# Patient Record
Sex: Female | Born: 1985 | Race: Black or African American | Hispanic: No | Marital: Single | Smoking: Never smoker
Health system: Southern US, Community
[De-identification: ages and names within clinical notes are randomized; demographics above are authoritative.]

## PROBLEM LIST (undated history)

## (undated) HISTORY — PX: HERNIA REPAIR: SHX51

## (undated) HISTORY — PX: TENDON REPAIR: SHX5111

---

## 2019-02-25 ENCOUNTER — Other Ambulatory Visit: Payer: Self-pay

## 2019-02-25 ENCOUNTER — Emergency Department
Admission: EM | Admit: 2019-02-25 | Discharge: 2019-02-25 | Disposition: A | Discharge status: Home / Self Care | Payer: 59 | Attending: Emergency Medicine | Admitting: Emergency Medicine

## 2019-02-25 ENCOUNTER — Emergency Department: Payer: 59

## 2019-02-25 ENCOUNTER — Encounter: Payer: Self-pay | Admitting: Emergency Medicine

## 2019-02-25 DIAGNOSIS — N760 Acute vaginitis: Secondary | ICD-10-CM

## 2019-02-25 DIAGNOSIS — M545 Low back pain, unspecified: Secondary | ICD-10-CM

## 2019-02-25 DIAGNOSIS — R1030 Lower abdominal pain, unspecified: Secondary | ICD-10-CM | POA: Diagnosis present

## 2019-02-25 DIAGNOSIS — B9689 Other specified bacterial agents as the cause of diseases classified elsewhere: Secondary | ICD-10-CM

## 2019-02-25 LAB — CBC WITH DIFFERENTIAL/PLATELET
Abs Immature Granulocytes: 0.01 10*3/uL (ref 0.00–0.07)
Basophils Absolute: 0 10*3/uL (ref 0.0–0.1)
Basophils Relative: 1 %
Eosinophils Absolute: 0.1 10*3/uL (ref 0.0–0.5)
Eosinophils Relative: 1 %
HCT: 37.7 % (ref 36.0–46.0)
Hemoglobin: 11.7 g/dL — ABNORMAL LOW (ref 12.0–15.0)
Immature Granulocytes: 0 %
Lymphocytes Relative: 41 %
Lymphs Abs: 2 10*3/uL (ref 0.7–4.0)
MCH: 26.8 pg (ref 26.0–34.0)
MCHC: 31 g/dL (ref 30.0–36.0)
MCV: 86.3 fL (ref 80.0–100.0)
Monocytes Absolute: 0.4 10*3/uL (ref 0.1–1.0)
Monocytes Relative: 7 %
Neutro Abs: 2.4 10*3/uL (ref 1.7–7.7)
Neutrophils Relative %: 50 %
Platelets: 289 10*3/uL (ref 150–400)
RBC: 4.37 MIL/uL (ref 3.87–5.11)
RDW: 14.6 % (ref 11.5–15.5)
WBC: 4.9 10*3/uL (ref 4.0–10.5)
nRBC: 0 % (ref 0.0–0.2)

## 2019-02-25 LAB — WET PREP, GENITAL
Sperm: NONE SEEN
Trich, Wet Prep: NONE SEEN
Yeast Wet Prep HPF POC: NONE SEEN

## 2019-02-25 LAB — CHLAMYDIA/NGC RT PCR (ARMC ONLY)
Chlamydia Tr: NOT DETECTED
N GONORRHOEAE: NOT DETECTED

## 2019-02-25 LAB — COMPREHENSIVE METABOLIC PANEL
ALT: 13 U/L (ref 0–44)
AST: 16 U/L (ref 15–41)
Albumin: 3.7 g/dL (ref 3.5–5.0)
Alkaline Phosphatase: 50 U/L (ref 38–126)
Anion gap: 7 (ref 5–15)
BILIRUBIN TOTAL: 0.4 mg/dL (ref 0.3–1.2)
BUN: 9 mg/dL (ref 6–20)
CO2: 26 mmol/L (ref 22–32)
Calcium: 9.2 mg/dL (ref 8.9–10.3)
Chloride: 106 mmol/L (ref 98–111)
Creatinine, Ser: 0.66 mg/dL (ref 0.44–1.00)
GFR calc Af Amer: >60 mL/min (ref >60)
Glucose, Bld: 96 mg/dL (ref 70–99)
Potassium: 3.8 mmol/L (ref 3.5–5.1)
Sodium: 139 mmol/L (ref 135–145)
TOTAL PROTEIN: 7.8 g/dL (ref 6.5–8.1)

## 2019-02-25 LAB — URINALYSIS, COMPLETE (UACMP) WITH MICROSCOPIC
Bacteria, UA: NONE SEEN
Bilirubin Urine: NEGATIVE
Glucose, UA: NEGATIVE mg/dL
KETONES UR: NEGATIVE mg/dL
Leukocytes,Ua: NEGATIVE
Nitrite: NEGATIVE
Protein, ur: NEGATIVE mg/dL
Specific Gravity, Urine: 1.002 — ABNORMAL LOW (ref 1.005–1.030)
pH: 6 (ref 5.0–8.0)

## 2019-02-25 LAB — PREGNANCY, URINE: Preg Test, Ur: NEGATIVE

## 2019-02-25 LAB — LIPASE, BLOOD: Lipase: 32 U/L (ref 11–51)

## 2019-02-25 MED ORDER — IOHEXOL 240 MG/ML SOLN: 25.0000 mL | INTRAMUSCULAR | Status: DC

## 2019-02-25 MED ORDER — IOHEXOL 240 MG/ML SOLN
50.0000 mL | Freq: Once | INTRAMUSCULAR | Status: AC
  Administered 2019-02-25: 50 mL via ORAL

## 2019-02-25 MED ORDER — ACETAMINOPHEN 500 MG PO TABS
1000.0000 mg | ORAL_TABLET | Freq: Once | ORAL | Status: AC
  Administered 2019-02-25: 1000 mg via ORAL
  Filled 2019-02-25: qty 2

## 2019-02-25 MED ORDER — IOHEXOL 300 MG/ML  SOLN
100.0000 mL | Freq: Once | INTRAMUSCULAR | Status: AC | PRN
  Administered 2019-02-25: 100 mL via INTRAVENOUS

## 2019-02-25 MED ORDER — METRONIDAZOLE 500 MG PO TABS: 500.0000 mg | ORAL_TABLET | Freq: Three times a day (TID) | ORAL | 0 refills | Status: DC

## 2019-02-25 MED ORDER — MORPHINE SULFATE (PF) 4 MG/ML IV SOLN
4.0000 mg | Freq: Once | INTRAVENOUS | Status: AC
  Administered 2019-02-25: 4 mg via INTRAVENOUS
  Filled 2019-02-25: qty 1

## 2019-02-25 MED ORDER — MORPHINE SULFATE (PF) 2 MG/ML IV SOLN
2.0000 mg | Freq: Once | INTRAVENOUS | Status: AC
  Administered 2019-02-25: 2 mg via INTRAVENOUS
  Filled 2019-02-25: qty 1

## 2019-02-25 MED ORDER — ONDANSETRON HCL 4 MG/2ML IJ SOLN
4.0000 mg | Freq: Once | INTRAMUSCULAR | Status: AC
  Administered 2019-02-25: 4 mg via INTRAVENOUS
  Filled 2019-02-25: qty 2

## 2019-02-25 NOTE — ED Triage Notes (Signed)
Says low abdominal pain and pain in left low back for a week, but got worse today and she could not go to work. No urinary symptoms.

## 2019-02-25 NOTE — ED Provider Notes (Signed)
[facility] Emergency Department Provider Note   ____________________________________________   First MD Initiated Contact with Patient 02/25/19 1237     (approximate)  I have reviewed the triage vital signs and the nursing notes.   HISTORY  Chief Complaint Abdominal Pain and Back Pain    HPI Tina Bryan is a 33 y.o. female who reports low back pain and lower abdominal pain going on for about a week but is gotten much worse in the last day or so.  Is worse with movement and slightly worse with walking as well.  She has no fever vomiting diarrhea no coughing no other complaints but the pain.  Pain is currently moderately severe deep and achy         History reviewed. No pertinent past medical history. History of depression and anxiety There are no active problems to display for this patient.   Past Surgical History:  Procedure Laterality Date  . HERNIA REPAIR    . TENDON REPAIR     left finger    Prior to Admission medications   Not on File    Allergies Patient has no known allergies.  No family history on file.  Social History Social History   Tobacco Use  . Smoking status: Never Smoker  . Smokeless tobacco: Never Used  Substance Use Topics  . Alcohol use: Never    Frequency: Never  . Drug use: Not on file    Review of Systems  Constitutional: No fever/chills Eyes: No visual changes. ENT: No sore throat. Cardiovascular: Denies chest pain. Respiratory: Denies shortness of breath. Gastrointestinal: Lower abdominal pain.  No nausea, no vomiting.  No diarrhea.  No constipation. Genitourinary: Negative for dysuria. Musculoskeletal: Low back pain. Skin: Negative for rash. Neurological: Negative for headaches, focal weakness   ____________________________________________   PHYSICAL EXAM:  VITAL SIGNS: ED Triage Vitals  Enc Vitals Group     BP 02/25/19 1227 118/61     Pulse Rate 02/25/19 1227 75     Resp 02/25/19 1227  14     Temp 02/25/19 1227 98 F (36.7 C)     Temp src --      SpO2 02/25/19 1227 100 %     Weight 02/25/19 1228 170 lb (77.1 kg)     Height 02/25/19 1228 5\' 4"  (1.626 m)     Head Circumference --      Peak Flow --      Pain Score 02/25/19 1227 7     Pain Loc --      Pain Edu? --      Excl. in GC? --     Constitutional: Alert and oriented.  Looks uncomfortable sitting on stretcher. Eyes: Conjunctivae are normal.  Head: Atraumatic. Nose: No congestion/rhinnorhea. Mouth/Throat: Mucous membranes are moist.  Oropharynx non-erythematous. Neck: No stridor.  Cardiovascular: Normal rate, regular rhythm. Grossly normal heart sounds.  Good peripheral circulation. Respiratory: Normal respiratory effort.  No retractions. Lungs CTAB. Gastrointestinal: Soft tender to palpation and percussion of the lower abdomen diffusely no distention. No abdominal bruits. No CVA tenderness. Genitourinary: Normal perineum.  Some dark blood in the vagina.  There is no particular tenderness with cervical motion or palpation the adnexa. Musculoskeletal: No lower extremity tenderness nor edema.  Patient is tender to palpation percussion in the paraspinous muscles in the low back.  She is not tender in the CVA areas and she is not tender over the spine itself. Neurologic:  Normal speech and language. No gross focal neurologic  deficits are appreciated. Skin:  Skin is warm, dry and intact. No rash noted.   ____________________________________________   LABS (all labs ordered are listed, but only abnormal results are displayed)  Labs Reviewed  CBC WITH DIFFERENTIAL/PLATELET - Abnormal; Notable for the following components:      Result Value   Hemoglobin 11.7 (*)    All other components within normal limits  URINALYSIS, COMPLETE (UACMP) WITH MICROSCOPIC - Abnormal; Notable for the following components:   Color, Urine COLORLESS (*)    APPearance CLEAR (*)    Specific Gravity, Urine 1.002 (*)    Hgb urine dipstick  LARGE (*)    All other components within normal limits  WET PREP, GENITAL  CHLAMYDIA/NGC RT PCR ([facility] ONLY)  COMPREHENSIVE METABOLIC PANEL  PREGNANCY, URINE  LIPASE, BLOOD   ____________________________________________  EKG   ____________________________________________  RADIOLOGY  ED MD interpretation:   Official radiology report(s): No results found.  ____________________________________________   PROCEDURES  Procedure(s) performed (including Critical Care):  Procedures   ____________________________________________   INITIAL IMPRESSION / ASSESSMENT AND PLAN / ED COURSE  CT scan wet prep and GC chlamydia are still pending.  We will sign the patient out to Dr. Sharion Settler.             ____________________________________________   FINAL CLINICAL IMPRESSION(S) / ED DIAGNOSES  Final diagnoses:  Bilateral low back pain without sciatica, unspecified chronicity  Lower abdominal pain     ED Discharge Orders    None       Note:  This document was prepared using Dragon voice recognition software and may include unintentional dictation errors.    Arnaldo Natal, MD 02/25/19 [phone]

## 2019-02-25 NOTE — Discharge Instructions (Signed)
No driving today.  Recommend follow-up with primary care physician and also set up an outpatient follow-up with  Please return to the emergency room right away if you are to develop a fever, severe nausea, your pain becomes severe or worsens, you are unable to keep food down, begin vomiting any dark or bloody fluid, you develop any dark or bloody stools, feel dehydrated, or other new concerns or symptoms arise.

## 2019-02-25 NOTE — ED Notes (Signed)
Patient [id number]. Vitals stable. NAD. Patient verbalized discharge instruction.

## 2019-02-25 NOTE — ED Notes (Signed)
Transported to CT 

## 2019-02-25 NOTE — ED Provider Notes (Signed)
Ct Abdomen Pelvis W Contrast  Result Date: 02/25/2019 CLINICAL DATA:  Acute lower abdominal pain. EXAM: CT ABDOMEN AND PELVIS WITH CONTRAST TECHNIQUE: Multidetector CT imaging of the abdomen and pelvis was performed using the standard protocol following bolus administration of intravenous contrast. CONTRAST:  OMNIPAQUE IOHEXOL 300 MG/ML  SOLN COMPARISON:  None. FINDINGS: Lower chest: No acute abnormality. Hepatobiliary: No focal liver abnormality is seen. No gallstones, gallbladder wall thickening, or biliary dilatation. Pancreas: Unremarkable. No pancreatic ductal dilatation or surrounding inflammatory changes. Spleen: Normal in size without focal abnormality. Adrenals/Urinary Tract: Adrenal glands are unremarkable. Kidneys are normal, without renal calculi, focal lesion, or hydronephrosis. Bladder is unremarkable. Stomach/Bowel: The stomach and appendix appear normal. There is no evidence of bowel obstruction. Mild wall thickening of the terminal ileum is noted without surrounding inflammation. No other abnormality is noted. Vascular/Lymphatic: No significant vascular findings are present. No enlarged abdominal or pelvic lymph nodes. Reproductive: Uterus and bilateral adnexa are unremarkable. Other: No abdominal wall hernia or abnormality. No abdominopelvic ascites. Musculoskeletal: No acute or significant osseous findings. IMPRESSION: Mild wall thickening of terminal ileum is noted without surrounding inflammation; the possibility of Crohn's disease or inflammatory bowel disease can not be excluded. Clinical correlation is recommended. No other abnormality seen in the abdomen or pelvis. Electronically Signed   By: Lupita Raider, M.D.   On: 02/25/2019 16:02     CT scan results reviewed, mild wall thickening of the terminal ileum.  No surrounding inflammatory change.  ----------------------------------------- 4:12 PM on 02/25/2019 -----------------------------------------  Spoke with the  patient, she reports that still very low bilateral discomfort primarily in her back region.  Does come up to her abdomen but denies it being right-sided in nature.  Will evaluate further rule out ovarian pathology with transvaginal ultrasound.  Pain about a 4 out of 10 and she would like a half dose of pain medicine which I have ordered for her.  She is fully awake and alert at this time.  Await GC negative  Vitals:   02/25/19 1435 02/25/19 1820  BP: 111/72 104/63  Pulse: 60 72  Resp: 16 14  Temp: 97.8 F (36.6 C) 98.5 F (36.9 C)  SpO2: 100% 100%   Resting comfortably.  Treat for bacterial vaginosis, discussed careful return precautions and follow-up including GI follow-up recommendation.  Patient in agreement  Return precautions and treatment recommendations and follow-up discussed with the patient who is agreeable with the plan.  Not driving herself.     Sharyn Creamer, MD 02/25/19 [phone]

## 2019-02-25 NOTE — ED Notes (Signed)
First nurse note: pt states abd pain "for a while now" but wants to be checked because her mom died of pancreatic cancer. Pt in NAD at this time.

## 2019-03-03 ENCOUNTER — Emergency Department
Admission: EM | Admit: 2019-03-03 | Discharge: 2019-03-03 | Disposition: A | Discharge status: Home / Self Care | Payer: 59 | Attending: Emergency Medicine | Admitting: Emergency Medicine

## 2019-03-03 ENCOUNTER — Encounter: Payer: Self-pay | Admitting: Emergency Medicine

## 2019-03-03 ENCOUNTER — Other Ambulatory Visit: Payer: Self-pay

## 2019-03-03 DIAGNOSIS — R0981 Nasal congestion: Secondary | ICD-10-CM | POA: Insufficient documentation

## 2019-03-03 DIAGNOSIS — J3489 Other specified disorders of nose and nasal sinuses: Secondary | ICD-10-CM | POA: Diagnosis not present

## 2019-03-03 DIAGNOSIS — J302 Other seasonal allergic rhinitis: Secondary | ICD-10-CM | POA: Insufficient documentation

## 2019-03-03 DIAGNOSIS — H5789 Other specified disorders of eye and adnexa: Secondary | ICD-10-CM | POA: Diagnosis present

## 2019-03-03 MED ORDER — ONDANSETRON 4 MG PO TBDP
4.0000 mg | ORAL_TABLET | Freq: Once | ORAL | Status: AC
  Administered 2019-03-03: 4 mg via ORAL
  Filled 2019-03-03: qty 1

## 2019-03-03 MED ORDER — ONDANSETRON 4 MG PO TBDP: 4.0000 mg | ORAL_TABLET | Freq: Three times a day (TID) | ORAL | 0 refills | Status: AC | PRN

## 2019-03-03 MED ORDER — FLUTICASONE PROPIONATE 50 MCG/ACT NA SUSP: 2.0000 | Freq: Every day | NASAL | 0 refills | Status: AC

## 2019-03-03 MED ORDER — CETIRIZINE HCL 10 MG PO TABS: 10.0000 mg | ORAL_TABLET | Freq: Every day | ORAL | 1 refills | Status: AC

## 2019-03-03 NOTE — Discharge Instructions (Addendum)
Follow-up with your primary care provider if any continued problems or can no clinic acute care.  Begin using Flonase nasal spray 2 sprays each side your nose every day.  Zyrtec 1 daily as needed for allergy symptoms.  Increase fluids.

## 2019-03-03 NOTE — ED Provider Notes (Signed)
[facility] Emergency Department Provider Note  ____________________________________________   First MD Initiated Contact with Patient 03/03/19 1042     (approximate)  I have reviewed the triage vital signs and the nursing notes.   HISTORY  Chief Complaint Eye Drainage and Nasal Congestion    HPI Tina Bryan is a 33 y.o. female presents to the ED with complaint of watery itchy eyes that began this morning.  Patient also has had some clear rhinorrhea.  She denies any history of cough, fever or recent travel or exposure to COVID-19.  Patient states in the past she has had problems with allergies and currently is not taking any allergy to medication.     History reviewed. No pertinent past medical history.  There are no active problems to display for this patient.   Past Surgical History:  Procedure Laterality Date  . HERNIA REPAIR    . TENDON REPAIR     left finger    Prior to Admission medications   Medication Sig Start Date End Date Taking? Authorizing Provider  cetirizine (ZYRTEC) 10 MG tablet Take 1 tablet (10 mg total) by mouth daily. 03/03/19   Tommi Rumps, PA-C  fluticasone (FLONASE) 50 MCG/ACT nasal spray Place 2 sprays into both nostrils daily. 03/03/19 03/02/20  Tommi Rumps, PA-C  ondansetron (ZOFRAN ODT) 4 MG disintegrating tablet Take 1 tablet (4 mg total) by mouth every 8 (eight) hours as needed for nausea or vomiting. 03/03/19   Tommi Rumps, PA-C    Allergies Patient has no known allergies.  No family history on file.  Social History Social History   Tobacco Use  . Smoking status: Never Smoker  . Smokeless tobacco: Never Used  Substance Use Topics  . Alcohol use: Never    Frequency: Never  . Drug use: Not on file    Review of Systems Constitutional: No fever/chills Eyes: No visual changes. ENT: No sore throat.  Positive clear rhinorrhea.  Positive bilateral ear pain. Cardiovascular: Denies chest pain.  Respiratory: Denies shortness of breath. Gastrointestinal: No abdominal pain.  No nausea, no vomiting.  Musculoskeletal: Negative for muscle aches. Skin: Negative for rash. Neurological: Negative for headaches, focal weakness or numbness. ___________________________________________   PHYSICAL EXAM:  VITAL SIGNS: ED Triage Vitals [03/03/19 1043]  Enc Vitals Group     BP      Pulse      Resp      Temp      Temp src      SpO2      Weight      Height      Head Circumference      Peak Flow      Pain Score 2     Pain Loc      Pain Edu?      Excl. in GC?    Constitutional: Alert and oriented. Well appearing and in no acute distress. Eyes: Conjunctivae are normal. PERRL. EOMI. Head: Atraumatic. Nose: Minimal congestion/no rhinnorhea.  TMs are dull but no erythema or injection is noted. Mouth/Throat: Mucous membranes are moist.  Oropharynx non-erythematous. Neck: No stridor.   Hematological/Lymphatic/Immunilogical: No cervical lymphadenopathy. Cardiovascular: Normal rate, regular rhythm. Grossly normal heart sounds.  Good peripheral circulation. Respiratory: Normal respiratory effort.  No retractions. Lungs CTAB. Musculoskeletal: Moves upper and lower extremities without any difficulty.  Normal gait was noted. Neurologic:  Normal speech and language. No gross focal neurologic deficits are appreciated. No gait instability. Skin:  Skin is warm, dry and  intact. No rash noted. Psychiatric: Mood and affect are normal. Speech and behavior are normal.  ____________________________________________   LABS (all labs ordered are listed, but only abnormal results are displayed)  Labs Reviewed - No data to display  PROCEDURES  Procedure(s) performed (including Critical Care):  Procedures   ____________________________________________   INITIAL IMPRESSION / ASSESSMENT AND PLAN / ED COURSE  As part of my medical decision making, I reviewed the following data within the  electronic MEDICAL RECORD NUMBER Notes from prior ED visits and [place] Controlled Substance Database  Patient presents to the ED with allergy-like symptoms including watery eyes, itching, clear rhinorrhea but no fever or upper respiratory symptoms suggestive of COVID-19.  Patient is afebrile while in the ED.  Patient has a history of seasonal allergies in the past but has not been taking any over-the-counter medication.  Patient was given a prescription for Zyrtec 10 mg daily and Flonase nose spray.  She also requested a prescription for Zofran as needed for nausea due to drainage.  Patient is to follow-up with her PCP if any continued problems.  ____________________________________________   FINAL CLINICAL IMPRESSION(S) / ED DIAGNOSES  Final diagnoses:  Seasonal allergies     ED Discharge Orders         Ordered    fluticasone (FLONASE) 50 MCG/ACT nasal spray  Daily     03/03/19 1132    cetirizine (ZYRTEC) 10 MG tablet  Daily     03/03/19 1132    ondansetron (ZOFRAN ODT) 4 MG disintegrating tablet  Every 8 hours PRN     03/03/19 1139           Note:  This document was prepared using Dragon voice recognition software and may include unintentional dictation errors.    Tommi Rumps, PA-C 03/03/19 1744    Arnaldo Natal, MD 03/06/19 [phone]

## 2019-03-03 NOTE — ED Triage Notes (Signed)
Presents with runny nose for the past few days  Then developed watery and itchy eyes this am  Denies any cough or fever   Hx of seasonal allergies as a teen  But states she is not taking any meds at this time for allergies

## 2020-12-23 IMAGING — CT CT ABDOMEN AND PELVIS WITH CONTRAST
2 of 4 series · 16 of 46 positions shown, 18 images · IV contrast (APPLIED)
Comparison: None.

CLINICAL DATA: Acute lower abdominal pain.

EXAM:
CT ABDOMEN AND PELVIS WITH CONTRAST
TECHNIQUE: Multidetector CT imaging of the abdomen and pelvis was performed
using the standard protocol following bolus administration of
intravenous contrast.
CONTRAST:  100mL OMNIPAQUE IOHEXOL 300 MG/ML  SOLN

[Series 2: routine abd/pel with · axial · 0.84mm/px · z∈[-961,-511]mm · 13 of 98 slices shown, 15 images]
[im 4/98  soft-tissue]
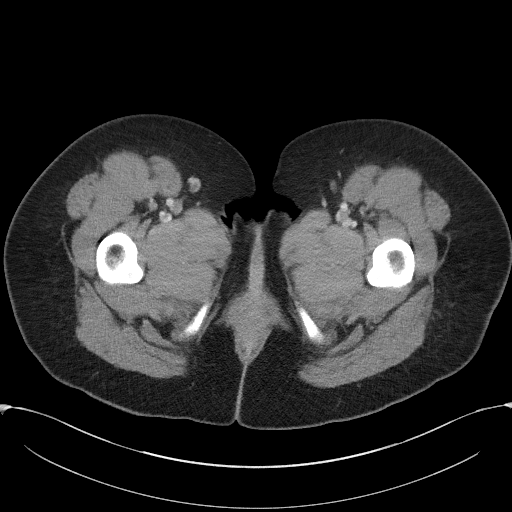
[im 4/98  bone]
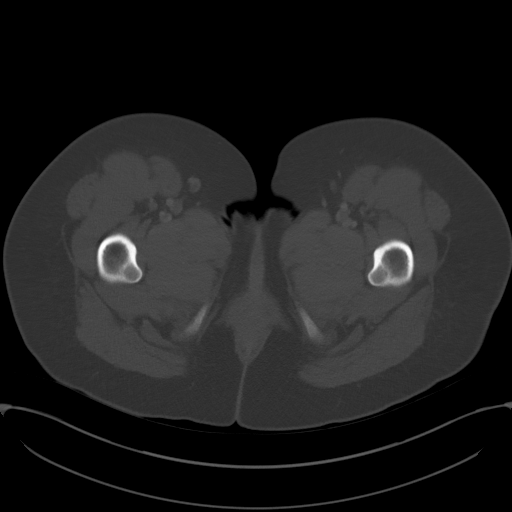
[im 12/98  soft-tissue]
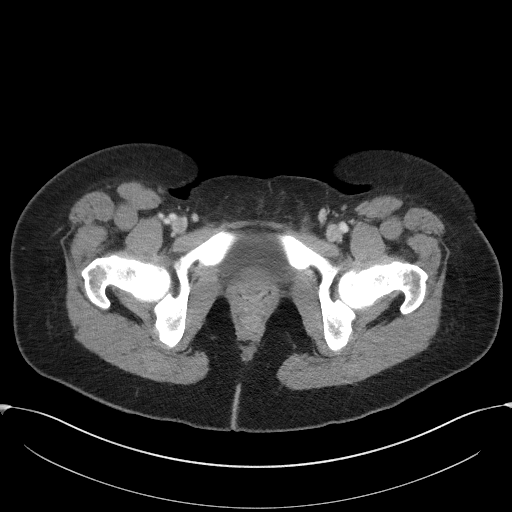
[im 20/98  soft-tissue]
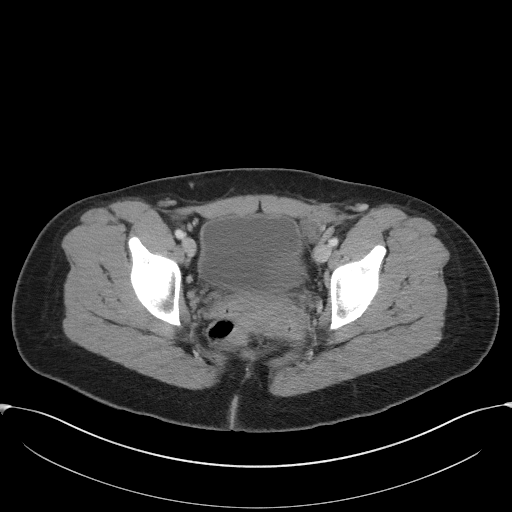
[im 28/98  soft-tissue]
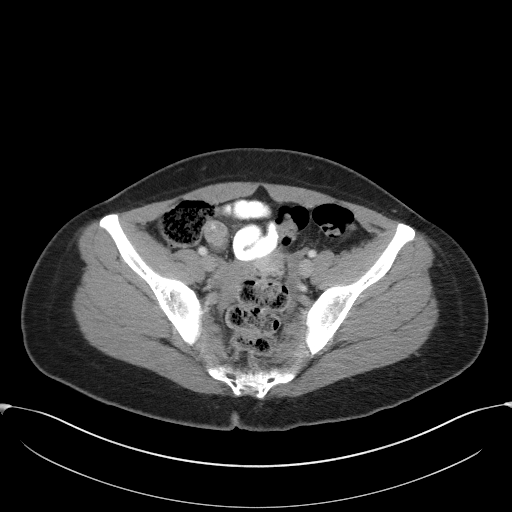
[im 35/98  soft-tissue]
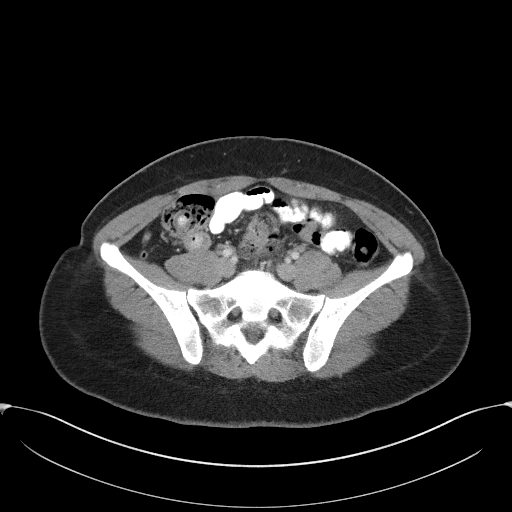
[im 43/98  soft-tissue]
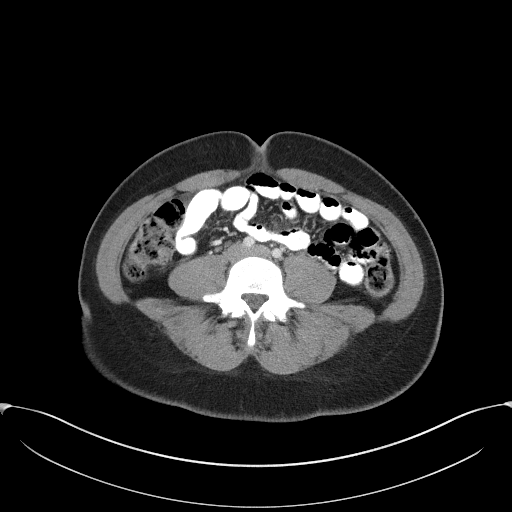
[im 51/98  soft-tissue]
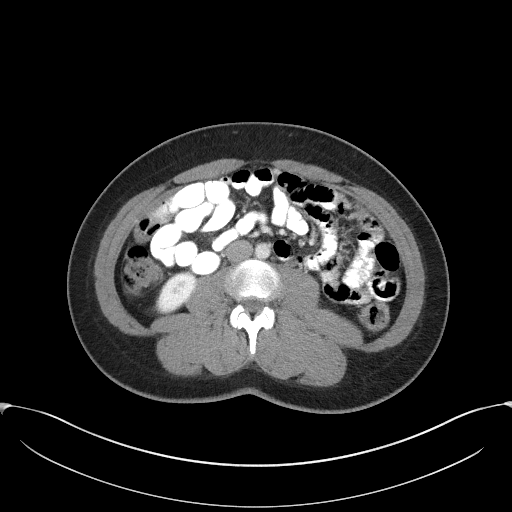
[im 55/98  soft-tissue]
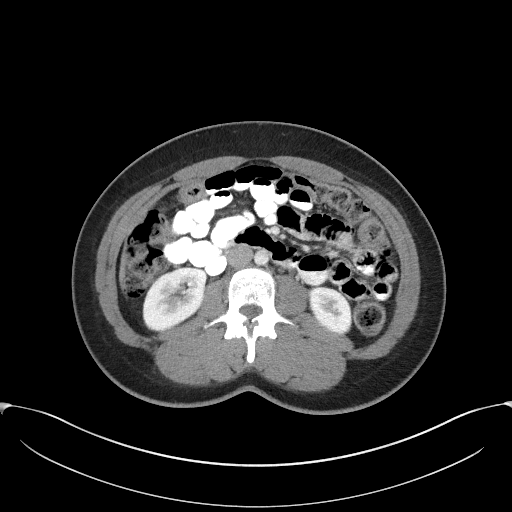
[im 63/98  soft-tissue]
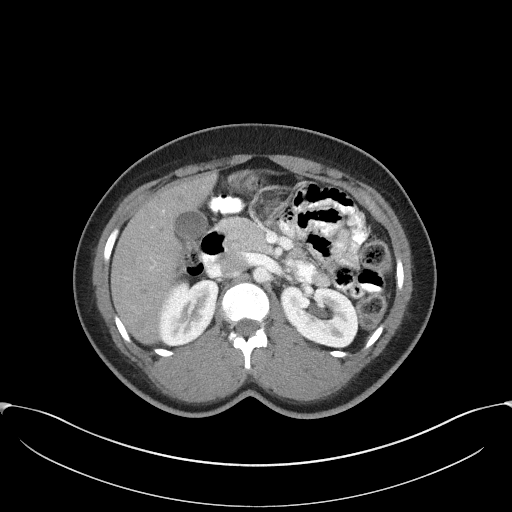
[im 63/98  bone]
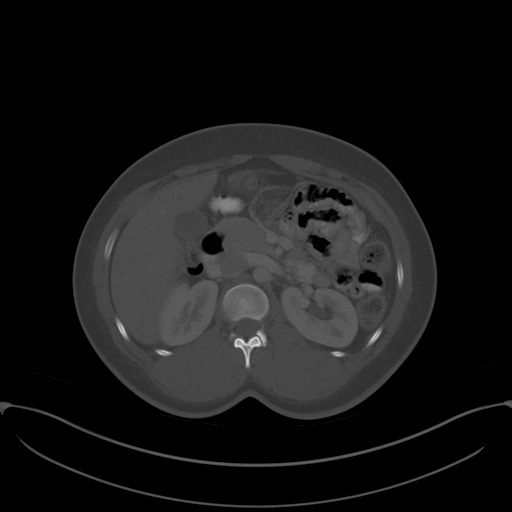
[im 70/98  soft-tissue]
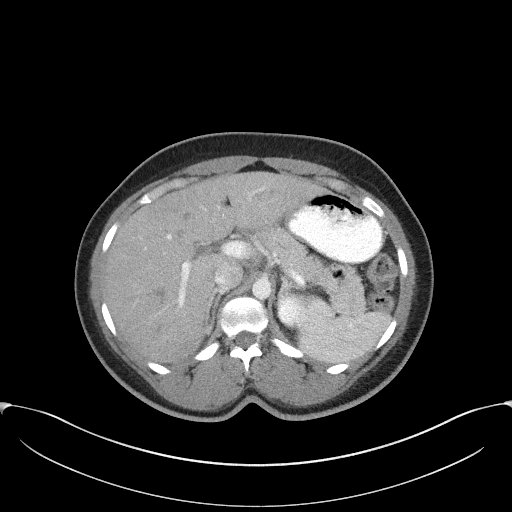
[im 78/98  soft-tissue]
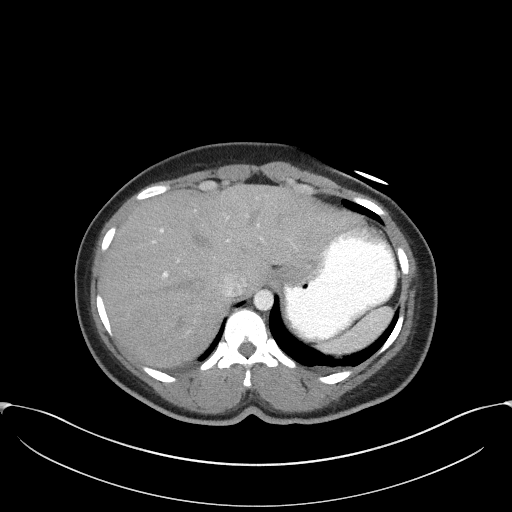
[im 86/98  soft-tissue]
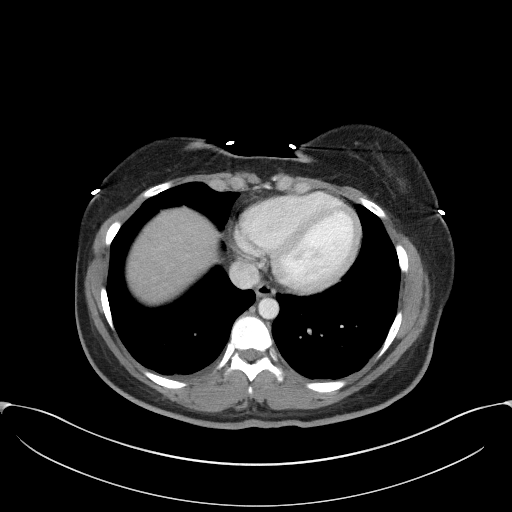
[im 94/98  soft-tissue]
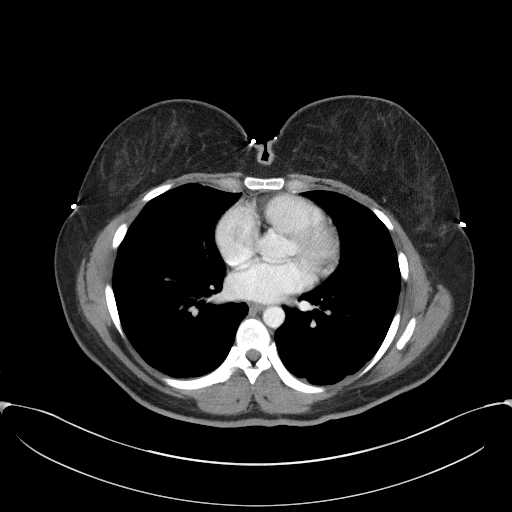

[Series 5: coronal st · coronal · 0.83mm/px · 3 of 80 slices shown]
[im 27/80  soft-tissue]
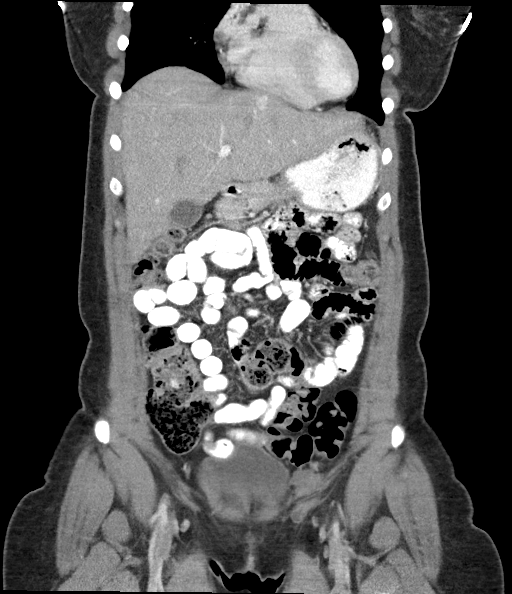
[im 36/80  soft-tissue]
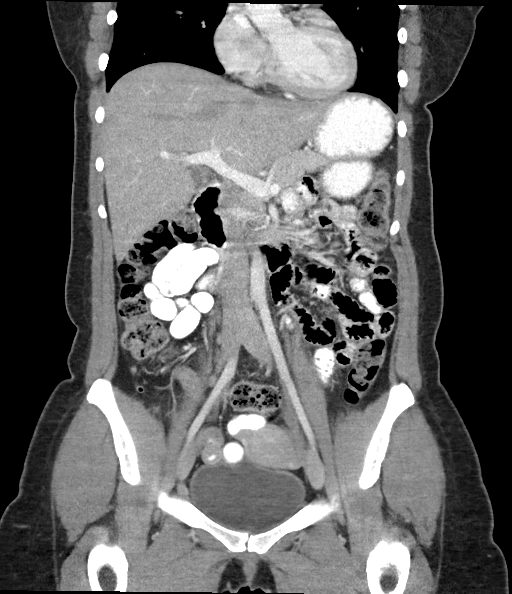
[im 44/80  soft-tissue]
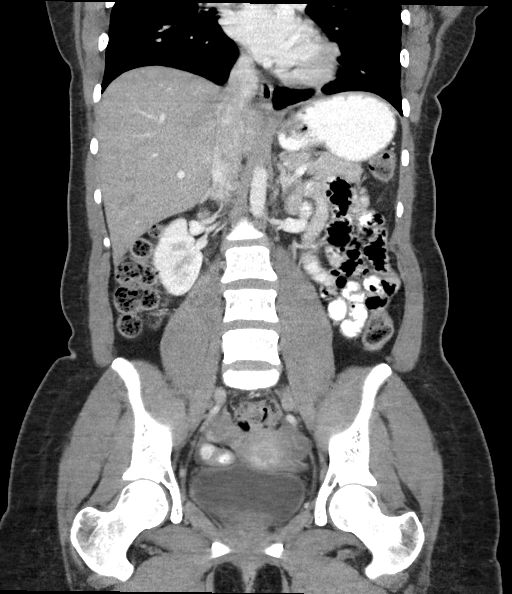

[16 of 46 positions shown; findings below may reference images not displayed]

FINDINGS: Lower chest: No acute abnormality.

Hepatobiliary: No focal liver abnormality is seen. No gallstones,
gallbladder wall thickening, or biliary dilatation.

Pancreas: Unremarkable. No pancreatic ductal dilatation or
surrounding inflammatory changes.

Spleen: Normal in size without focal abnormality.

Adrenals/Urinary Tract: Adrenal glands are unremarkable. Kidneys are
normal, without renal calculi, focal lesion, or hydronephrosis.
Bladder is unremarkable.

Stomach/Bowel: The stomach and appendix appear normal. There is no
evidence of bowel obstruction. Mild wall thickening of the terminal
ileum is noted without surrounding inflammation. No other
abnormality is noted.

Vascular/Lymphatic: No significant vascular findings are present. No
enlarged abdominal or pelvic lymph nodes.

Reproductive: Uterus and bilateral adnexa are unremarkable.

Other: No abdominal wall hernia or abnormality. No abdominopelvic
ascites.

Musculoskeletal: No acute or significant osseous findings.
IMPRESSION: Mild wall thickening of terminal ileum is noted without surrounding
inflammation; the possibility of Crohn's disease or inflammatory
bowel disease can not be excluded. Clinical correlation is
recommended.

No other abnormality seen in the abdomen or pelvis.

## 2020-12-23 IMAGING — US ARTERIAL AND VENOUS ULTRASOUND OF THE ABDOMEN PELVIS AND SCROTUM
1 series · 13 of 25 positions shown · non-contrast
Comparison: CT abdomen and pelvis 02/25/2019

CLINICAL DATA: Pelvic and low back pain for 1 week, increasing in
severity.

EXAM:
TRANSABDOMINAL AND TRANSVAGINAL ULTRASOUND OF PELVIS
DOPPLER ULTRASOUND OF OVARIES
TECHNIQUE: Both transabdominal and transvaginal ultrasound examinations of the
pelvis were performed. Transabdominal technique was performed for
global imaging of the pelvis including uterus, ovaries, adnexal
regions, and pelvic cul-de-sac.
It was necessary to proceed with endovaginal exam following the
transabdominal exam to visualize the ovaries. Color and duplex
Doppler ultrasound was utilized to evaluate blood flow to the
ovaries.

[Series 1: arterial and venous ultrasound of the abdomen pelv · 13 of 94 slices shown]
[im 1/94]
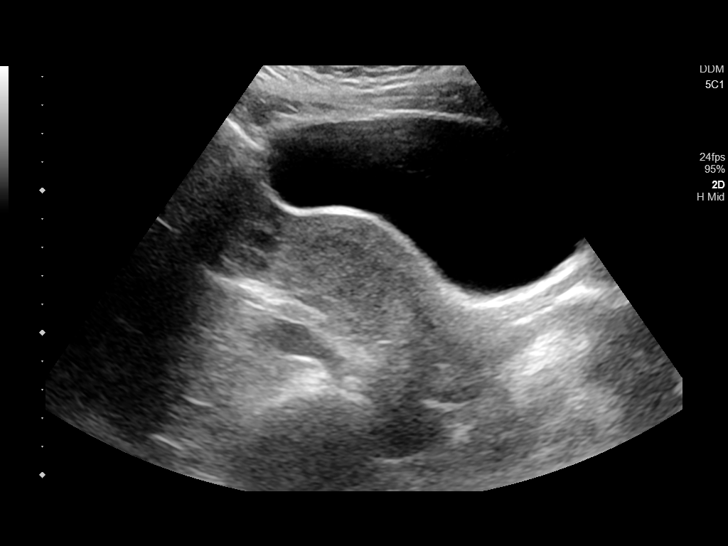
[im 8/94]
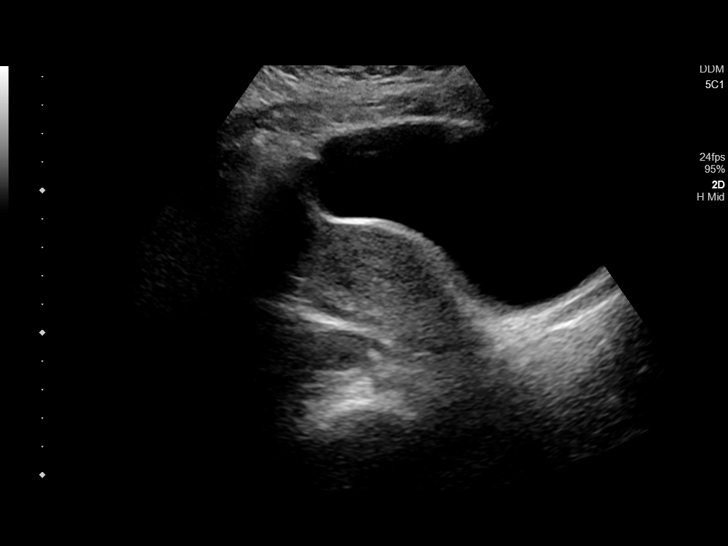
[im 16/94]
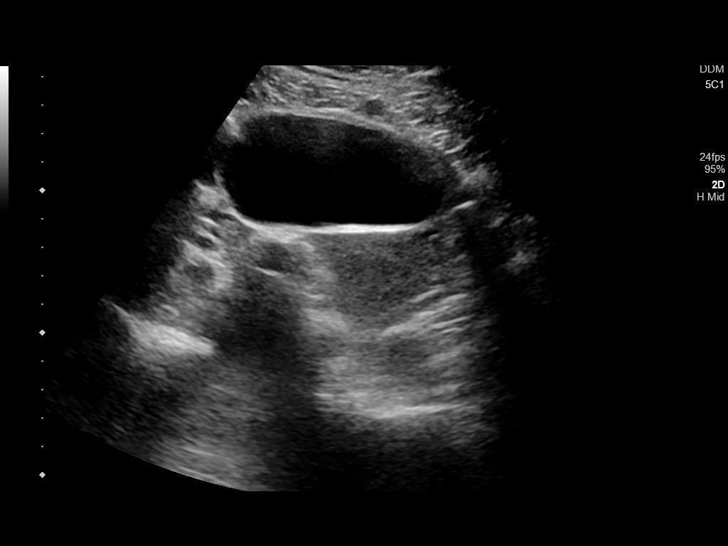
[im 24/94]
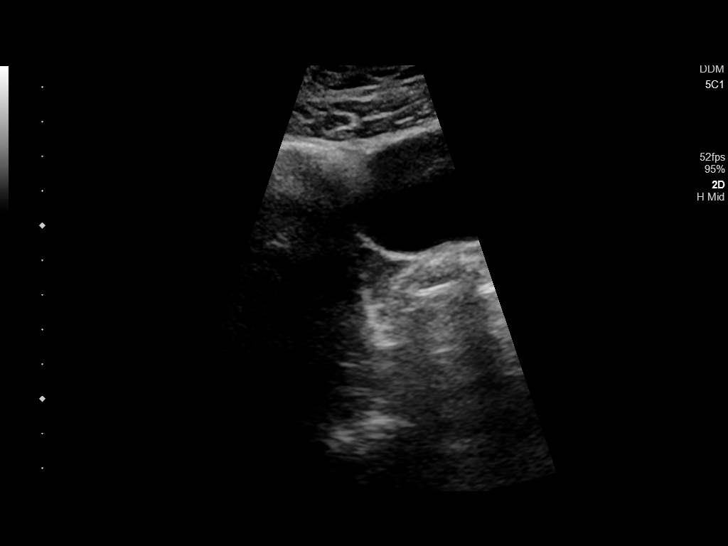
[im 32/94]
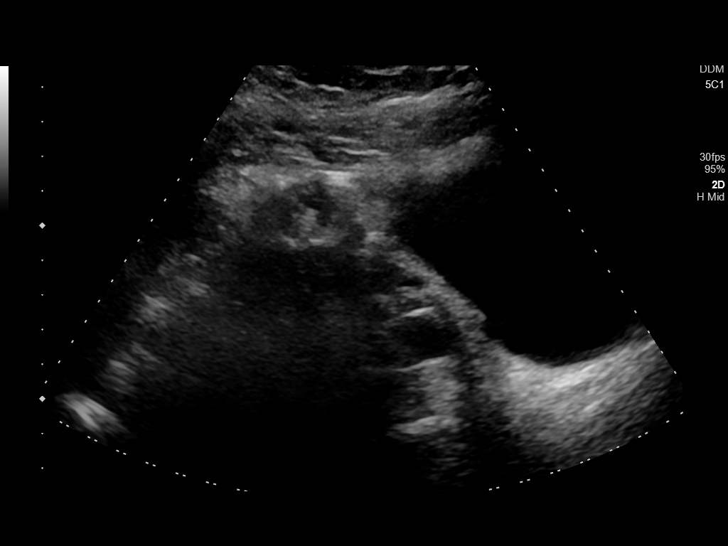
[im 39/94]
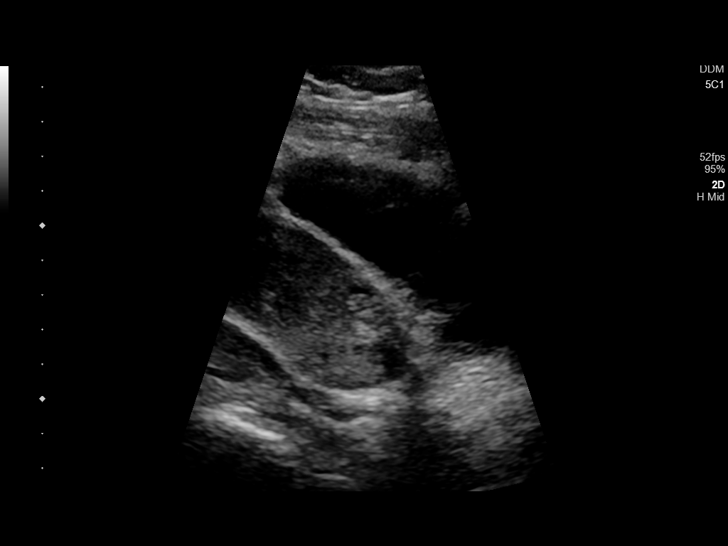
[im 47/94]
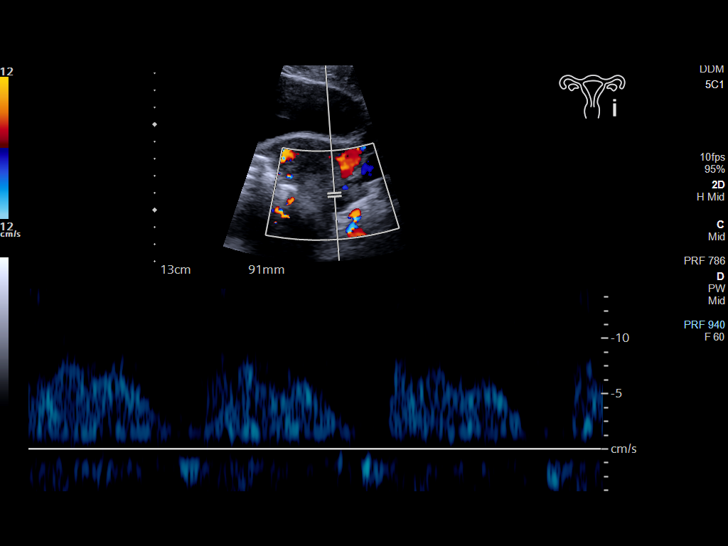
[im 55/94]
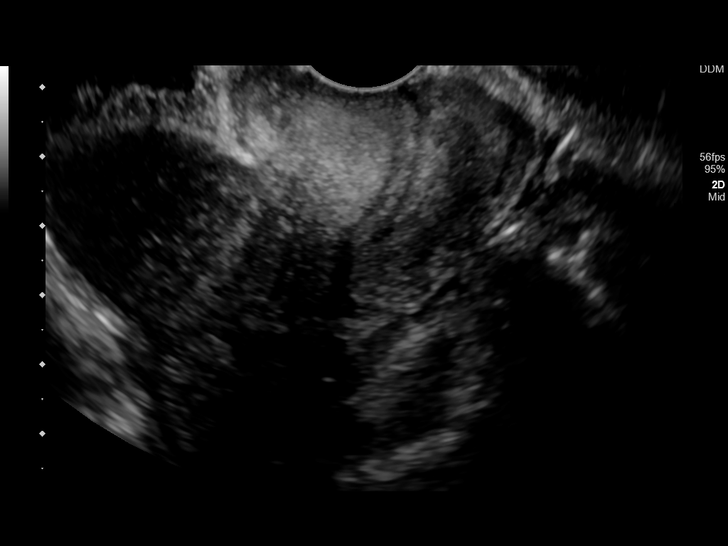
[im 63/94]
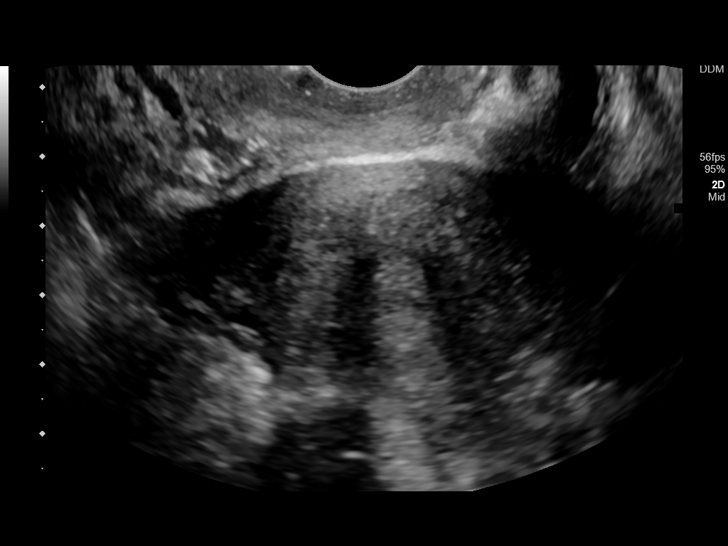
[im 70/94]
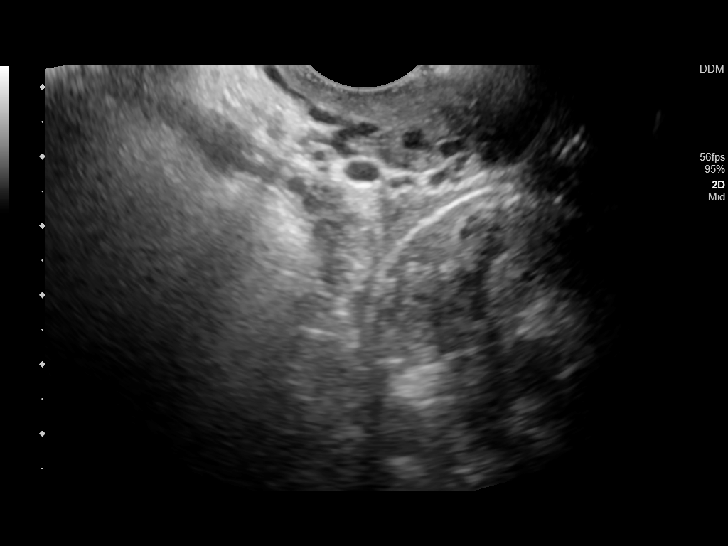
[im 78/94]
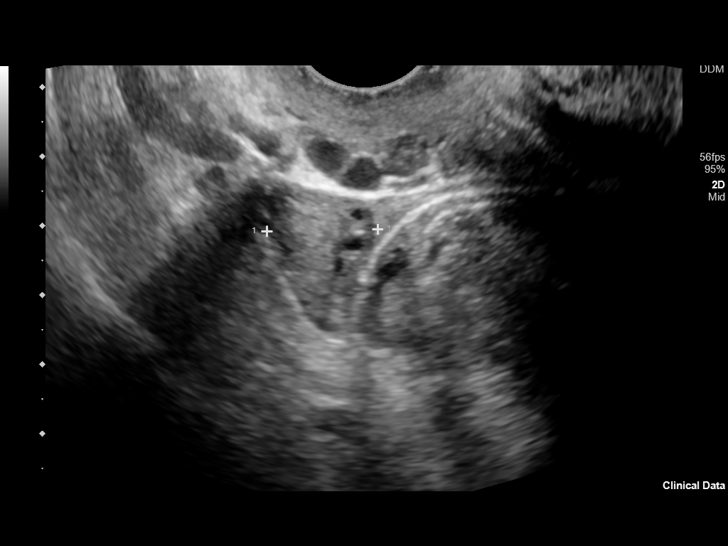
[im 86/94]
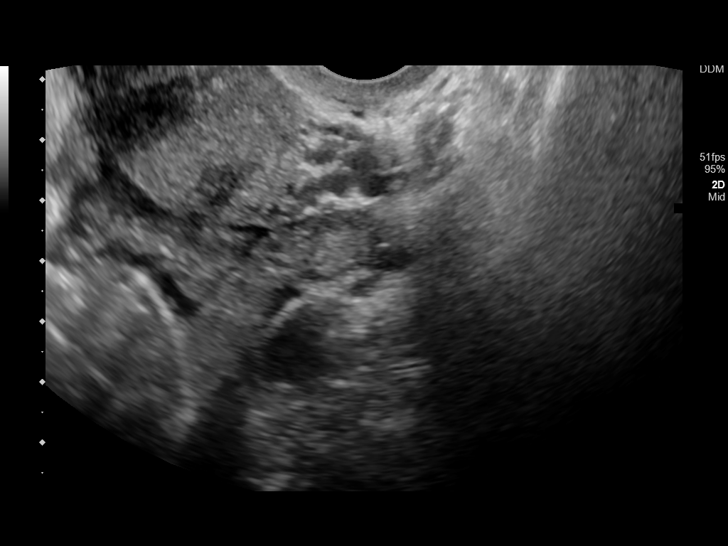
[im 94/94]
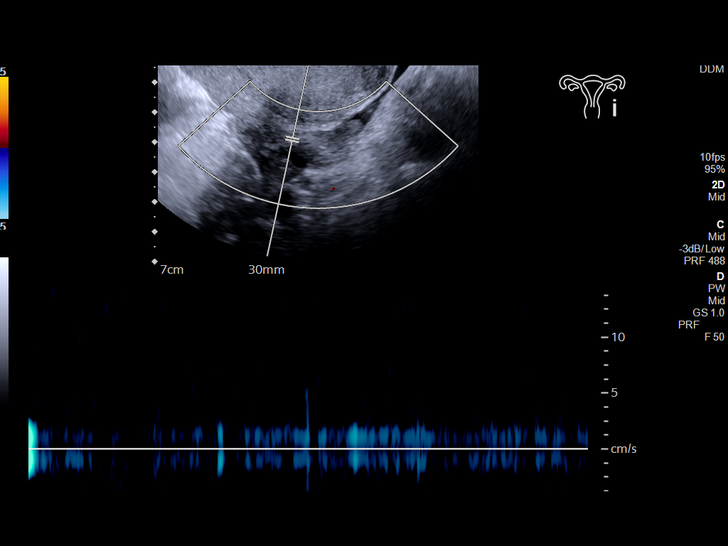

[13 of 25 positions shown; findings below may reference images not displayed]

FINDINGS: Uterus

Measurements: 8.9 x 3.2 x 5.3 cm = volume: 80 mL. No fibroids or
other mass visualized.

Endometrium

Thickness: 4 mm.  No focal abnormality visualized.

Right ovary

Measurements: 2.9 x 1.9 x 1.6 cm = volume: 4.5 mL. Normal
appearance/no adnexal mass.

Left ovary

Measurements: 3.8 x 1.4 x 2.8 cm = volume: 7.5 mL. Normal
appearance/no adnexal mass.

Pulsed Doppler evaluation of both ovaries demonstrates normal
low-resistance arterial and venous waveforms.

Other findings

Trace pelvic free fluid, potentially physiologic.
IMPRESSION: Negative pelvic ultrasound.
